# Patient Record
Sex: Female | Born: 1975 | Race: White | Hispanic: No | Marital: Single | Smoking: Never smoker
Health system: Southern US, Community
[De-identification: ages and names within clinical notes are randomized; demographics above are authoritative.]

## PROBLEM LIST (undated history)

## (undated) HISTORY — PX: MOLE REMOVAL: SHX2046

---

## 1998-06-23 ENCOUNTER — Other Ambulatory Visit: Admission: RE | Admit: 1998-06-23 | Discharge: 1998-06-23 | Payer: Self-pay | Admitting: Gynecology

## 1999-07-09 ENCOUNTER — Other Ambulatory Visit: Admission: RE | Admit: 1999-07-09 | Discharge: 1999-07-09 | Payer: Self-pay | Admitting: Gynecology

## 2003-02-11 ENCOUNTER — Other Ambulatory Visit: Admission: RE | Admit: 2003-02-11 | Discharge: 2003-02-11 | Payer: Self-pay | Admitting: Gynecology

## 2004-02-13 ENCOUNTER — Other Ambulatory Visit: Admission: RE | Admit: 2004-02-13 | Discharge: 2004-02-13 | Payer: Self-pay | Admitting: Gynecology

## 2004-08-14 ENCOUNTER — Encounter (INDEPENDENT_AMBULATORY_CARE_PROVIDER_SITE_OTHER): Payer: Self-pay | Admitting: Specialist

## 2004-08-14 ENCOUNTER — Ambulatory Visit (HOSPITAL_BASED_OUTPATIENT_CLINIC_OR_DEPARTMENT_OTHER): Admission: RE | Admit: 2004-08-14 | Discharge: 2004-08-14 | Payer: Self-pay | Admitting: Plastic Surgery

## 2004-08-14 ENCOUNTER — Ambulatory Visit (HOSPITAL_COMMUNITY): Admission: RE | Admit: 2004-08-14 | Discharge: 2004-08-14 | Payer: Self-pay | Admitting: Plastic Surgery

## 2005-02-23 ENCOUNTER — Other Ambulatory Visit: Admission: RE | Admit: 2005-02-23 | Discharge: 2005-02-23 | Payer: Self-pay | Admitting: Gynecology

## 2005-04-20 ENCOUNTER — Other Ambulatory Visit: Admission: RE | Admit: 2005-04-20 | Discharge: 2005-04-20 | Payer: Self-pay | Admitting: Gynecology

## 2008-10-01 ENCOUNTER — Ambulatory Visit: Payer: Self-pay | Admitting: Gastroenterology

## 2008-10-01 DIAGNOSIS — K625 Hemorrhage of anus and rectum: Secondary | ICD-10-CM

## 2008-10-01 LAB — CONVERTED CEMR LAB: Tissue Transglutaminase Ab, IgA: 0.3 units (ref <7)

## 2008-10-02 LAB — CONVERTED CEMR LAB
ALT: 11 units/L (ref 0–35)
AST: 25 units/L (ref 0–37)
Alkaline Phosphatase: 62 units/L (ref 39–117)
Basophils Absolute: 0 10*3/uL (ref 0.0–0.1)
Creatinine, Ser: 0.7 mg/dL (ref 0.4–1.2)
Eosinophils Absolute: 0.2 10*3/uL (ref 0.0–0.7)
GFR calc non Af Amer: 102.58 mL/min (ref >60)
HCT: 38.1 % (ref 36.0–46.0)
Hemoglobin: 13.3 g/dL (ref 12.0–15.0)
Lymphs Abs: 2.5 10*3/uL (ref 0.7–4.0)
MCHC: 35 g/dL (ref 30.0–36.0)
Monocytes Absolute: 0.5 10*3/uL (ref 0.1–1.0)
Neutro Abs: 5.4 10*3/uL (ref 1.4–7.7)
RDW: 12.3 % (ref 11.5–14.6)
Total Bilirubin: 0.8 mg/dL (ref 0.3–1.2)

## 2008-10-14 ENCOUNTER — Telehealth (INDEPENDENT_AMBULATORY_CARE_PROVIDER_SITE_OTHER): Payer: Self-pay | Admitting: *Deleted

## 2010-04-30 ENCOUNTER — Other Ambulatory Visit: Payer: Self-pay | Admitting: Gynecology

## 2010-07-24 NOTE — Op Note (Signed)
NAMEJOELEEN, WORTLEY               ACCOUNT NO.:  [id number]   MEDICAL RECORD NO.:  [id number]          PATIENT TYPE:  AMB   LOCATION:  DSC                          FACILITY:  [facility]   PHYSICIAN:  Etter Sjogren, M.D.     DATE OF BIRTH:  03/22/1975   DATE OF PROCEDURE:  08/14/2004  DATE OF DISCHARGE:                                 OPERATIVE REPORT   PREOPERATIVE DIAGNOSIS:  Melanoma in situ, left clavicle.   POSTOPERATIVE DIAGNOSES:  1.  Melanoma in situ, left clavicle.  Melanoma required an excision greater      than 1.0 cm in diameter.  2.  Complicated open wound of the left clavicle 3.5 cm.   OPERATION PERFORMED:  1.  Excision, melanoma with excision greater than 1.0 cm in diameter.  2.  Complex wound closure of left clavicle greater than 3 cm.   SURGEON:  Etter Sjogren, M.D.   ANESTHESIA:  1% Xylocaine with epinephrine plus bicarb.   INDICATIONS FOR PROCEDURE:  The patient is a 35 year old woman referred by  dermatologist, with melanoma in situ left clavicle for excision of this area  with 5 mm margins.  The nature of the procedure and the risks were discussed  with her including the distinct possibility of hypertrophic scarring,  possibility of further surgery depending upon the final pathology report.  She understood all of this and wished to proceed.   DESCRIPTION OF PROCEDURE:  The patient was taken to the operating room and  placed supine.  The area was carefully marked for the excision with margins  of approximately 5 mm around the previous shave biopsy site.  She was  prepped with Betadine and draped with sterile drapes.  Satisfactory local  anesthesia achieved.  The excision was performed as a full thickness  excision.  Irrigated thoroughly.  Good hemostasis was confirmed.  A layered  closure with 4-0 Monocryl, interrupted inverted deep sutures, 4-0 Monocryl  interrupted inverted deep dermal sutures and a running 3-0 Prolene  subcuticular suture.  Steri-Strips and dry  sterile dressing were applied.  The patient tolerated the procedure well.   DISPOSITION:  Will recheck back in 10 to 14 days.       DB/MEDQ  D:  08/14/2004  T:  08/14/2004  Job:  [id number]

## 2011-05-24 ENCOUNTER — Other Ambulatory Visit: Payer: Self-pay

## 2012-07-10 ENCOUNTER — Other Ambulatory Visit: Payer: Self-pay | Admitting: Gynecology

## 2013-07-16 ENCOUNTER — Other Ambulatory Visit: Payer: Self-pay | Admitting: Gynecology

## 2018-09-28 ENCOUNTER — Other Ambulatory Visit: Payer: Self-pay | Admitting: Obstetrics and Gynecology

## 2018-09-28 DIAGNOSIS — N644 Mastodynia: Secondary | ICD-10-CM

## 2018-10-03 ENCOUNTER — Other Ambulatory Visit: Payer: Self-pay | Admitting: Obstetrics and Gynecology

## 2018-10-03 DIAGNOSIS — N6012 Diffuse cystic mastopathy of left breast: Secondary | ICD-10-CM

## 2018-10-03 DIAGNOSIS — N644 Mastodynia: Secondary | ICD-10-CM

## 2018-10-05 ENCOUNTER — Ambulatory Visit
Admission: RE | Admit: 2018-10-05 | Discharge: 2018-10-05 | Disposition: A | Discharge status: Home / Self Care | Payer: BC Managed Care – PPO | Source: Ambulatory Visit | Attending: Obstetrics and Gynecology | Admitting: Obstetrics and Gynecology

## 2018-10-05 ENCOUNTER — Ambulatory Visit: Admission: RE | Admit: 2018-10-05 | Payer: Self-pay | Source: Ambulatory Visit

## 2018-10-05 ENCOUNTER — Other Ambulatory Visit: Payer: Self-pay

## 2018-10-05 DIAGNOSIS — N6012 Diffuse cystic mastopathy of left breast: Secondary | ICD-10-CM

## 2018-10-05 DIAGNOSIS — N644 Mastodynia: Secondary | ICD-10-CM

## 2019-09-24 ENCOUNTER — Other Ambulatory Visit: Payer: Self-pay | Admitting: Obstetrics and Gynecology

## 2019-09-24 DIAGNOSIS — R928 Other abnormal and inconclusive findings on diagnostic imaging of breast: Secondary | ICD-10-CM

## 2019-10-01 ENCOUNTER — Other Ambulatory Visit: Payer: Self-pay

## 2019-10-01 ENCOUNTER — Ambulatory Visit
Admission: RE | Admit: 2019-10-01 | Discharge: 2019-10-01 | Disposition: A | Discharge status: Home / Self Care | Payer: BC Managed Care – PPO | Source: Ambulatory Visit | Attending: Obstetrics and Gynecology | Admitting: Obstetrics and Gynecology

## 2019-10-01 DIAGNOSIS — R928 Other abnormal and inconclusive findings on diagnostic imaging of breast: Secondary | ICD-10-CM

## 2020-09-09 IMAGING — MG DIGITAL DIAGNOSTIC UNILATERAL LEFT MAMMOGRAM WITH TOMO AND CAD
4 series · 4 of 12 positions shown · non-contrast
Comparison: Previous exam(s).

CLINICAL DATA: 42-year-old patient with 1 day history of recent
stinging left breast pain that was diffuse. She is not having pain
currently. She does not palpate a lump.

EXAM:
DIGITAL DIAGNOSTIC UNILATERAL LEFT MAMMOGRAM WITH CAD AND TOMO

[L CC synth-2D]
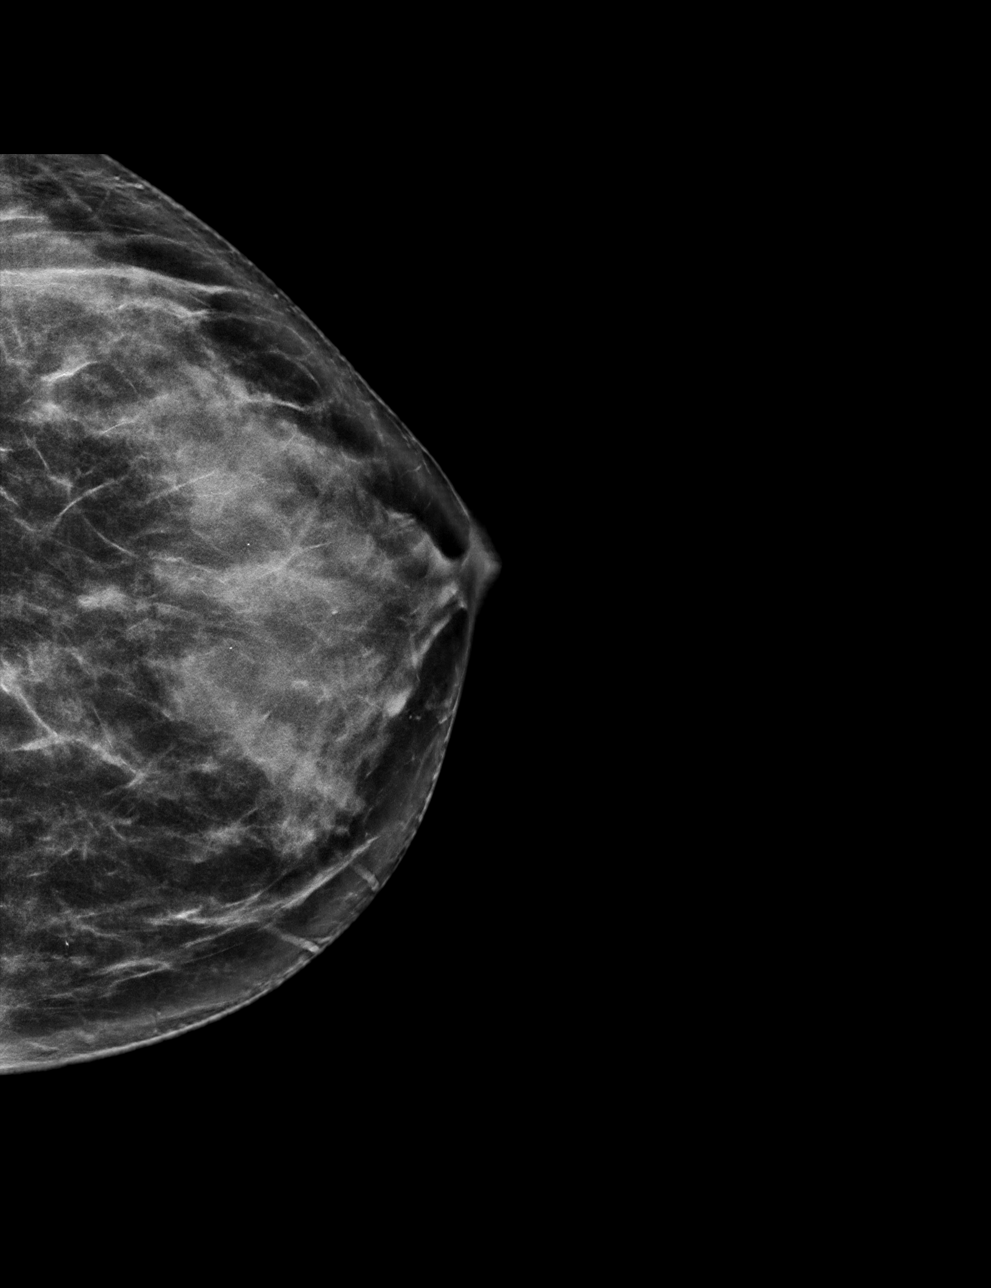

[L MLO synth-2D]
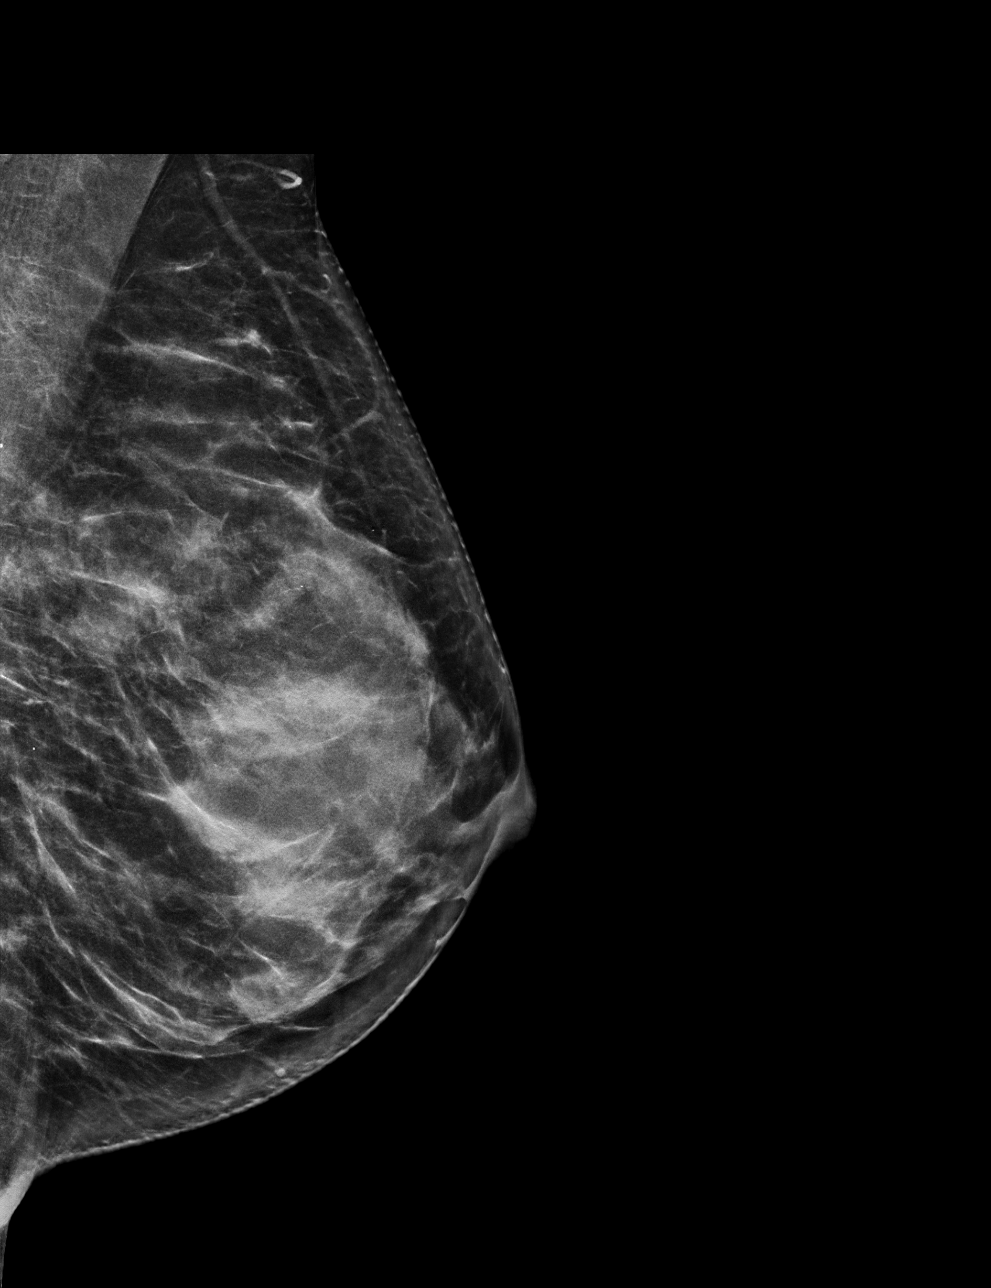

[L MLO tomo · tomo slice 34/67.0]
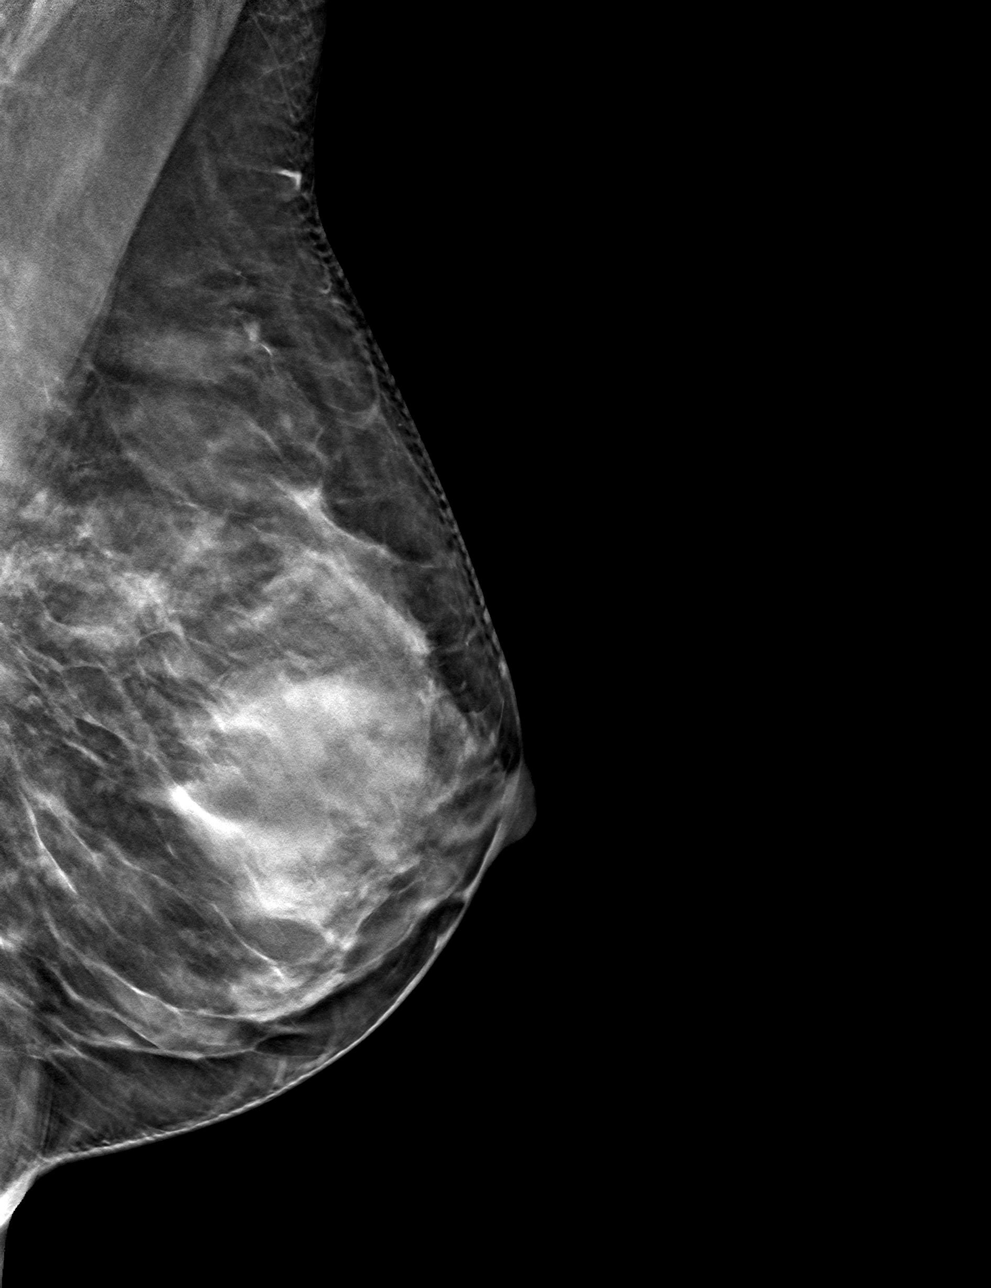

[L CC tomo · tomo slice 34/67.0]
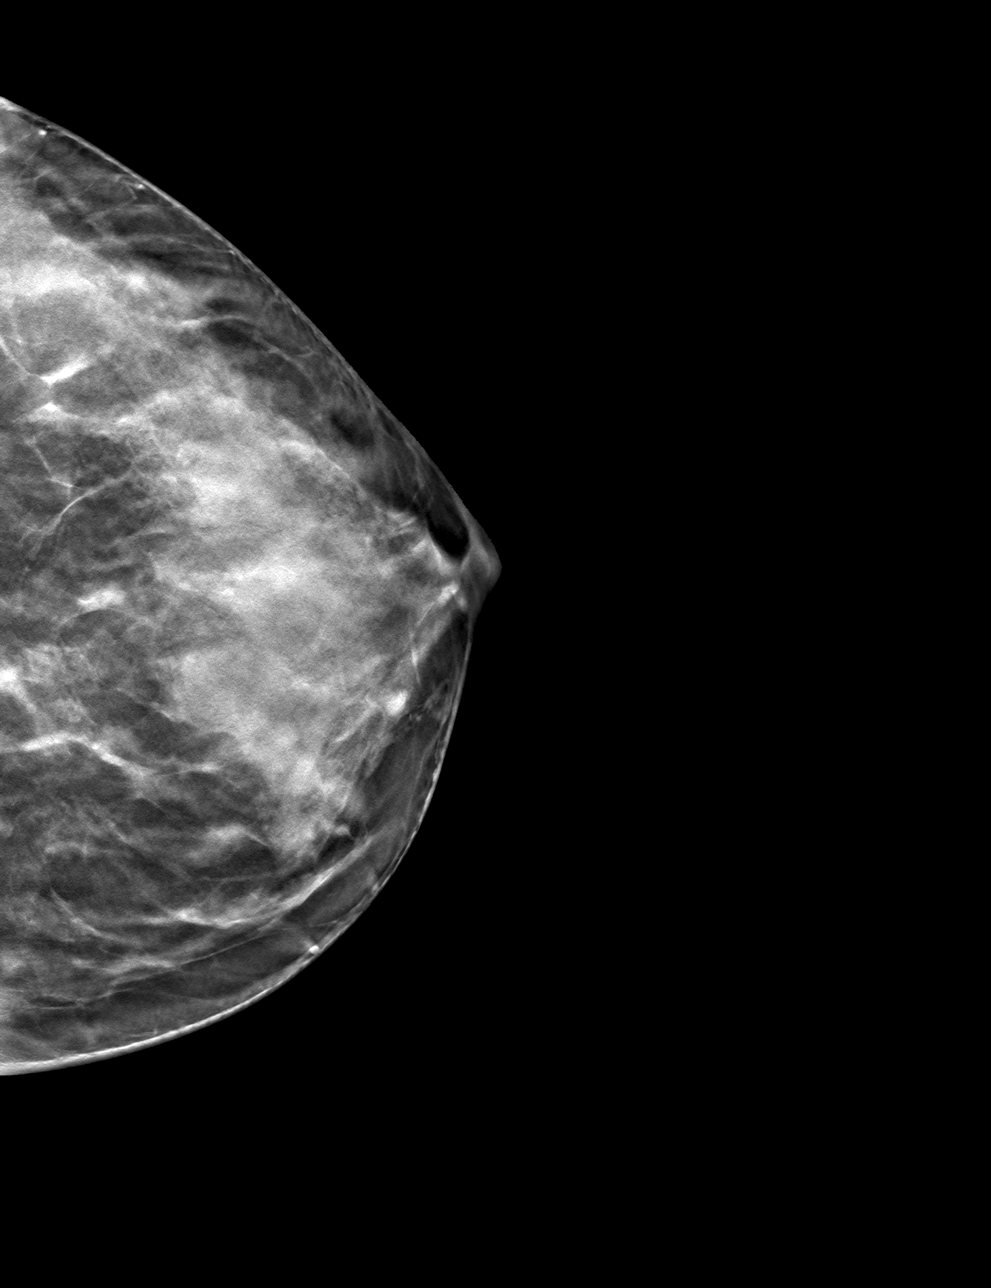

[4 of 12 positions shown; findings below may reference images not displayed]

ACR Breast Density Category c: The breast tissue is heterogeneously
dense, which may obscure small masses.
FINDINGS: No mass, architectural distortion, or suspicious microcalcification
is identified to suggest malignancy in the left breast. Normal skin
thickness.

Mammographic images were processed with CAD.
IMPRESSION: No evidence of malignancy in the left breast.

RECOMMENDATION:
Bilateral screening mammogram is recommended in July 2019.

I have discussed the findings and recommendations with the patient.
Results were also provided in writing at the conclusion of the
visit. If applicable, a reminder letter will be sent to the patient
regarding the next appointment.

BI-RADS CATEGORY  1: Negative.

## 2021-09-05 IMAGING — MG MM DIGITAL DIAGNOSTIC UNILAT*L* W/ TOMO W/ CAD
4 series · 4 of 12 positions shown · non-contrast
Comparison: Previous exam(s).

CLINICAL DATA: Screening recall for a possible left breast mass.

EXAM:
DIGITAL DIAGNOSTIC LEFT MAMMOGRAM WITH CAD AND TOMO
ULTRASOUND LEFT BREAST

[L CC synth-2D]
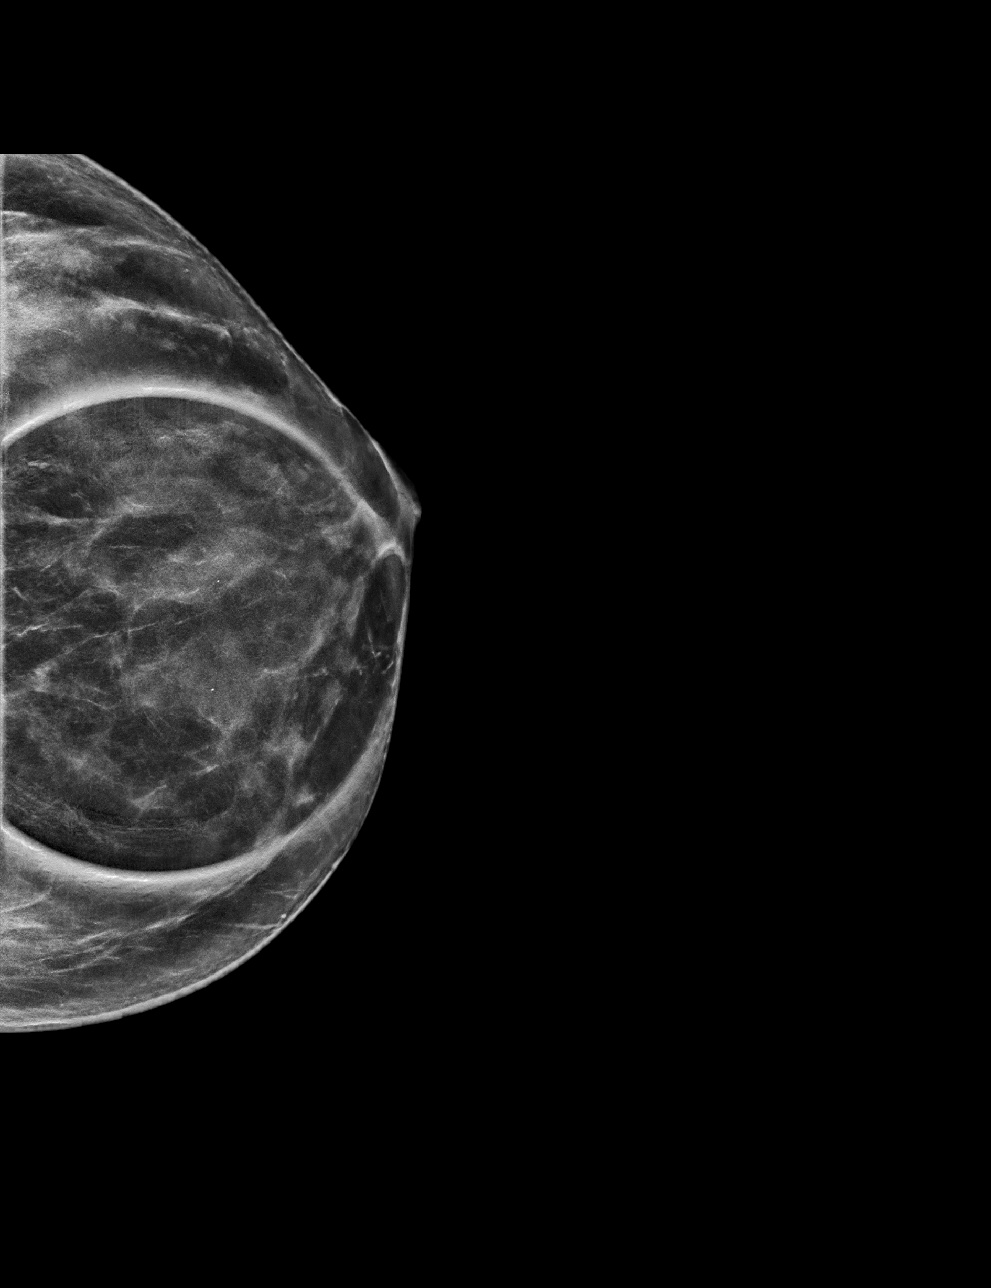

[L MLO synth-2D]
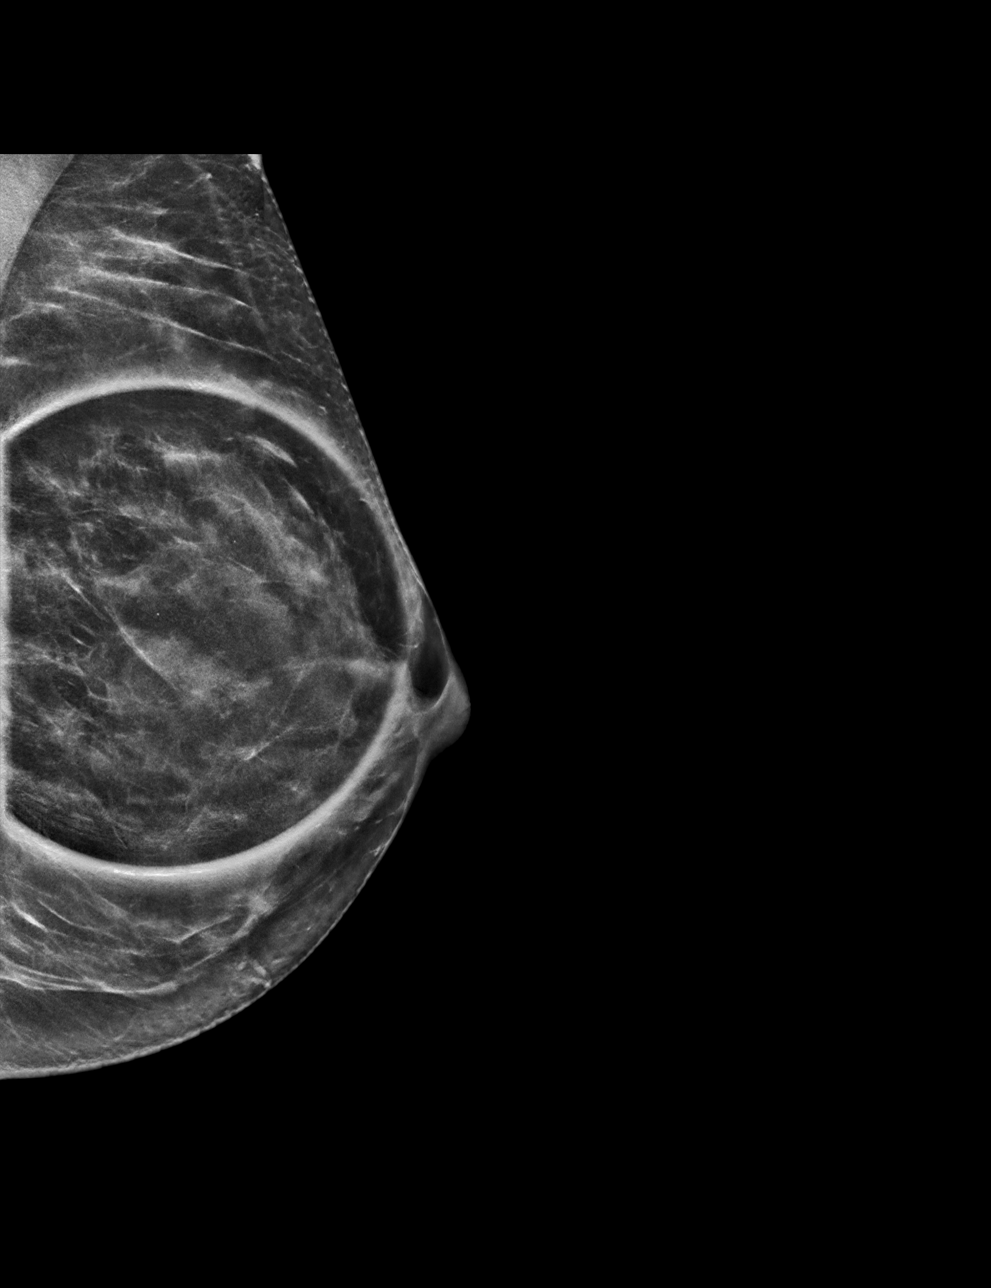

[L CC tomo · tomo slice 31/61.0]
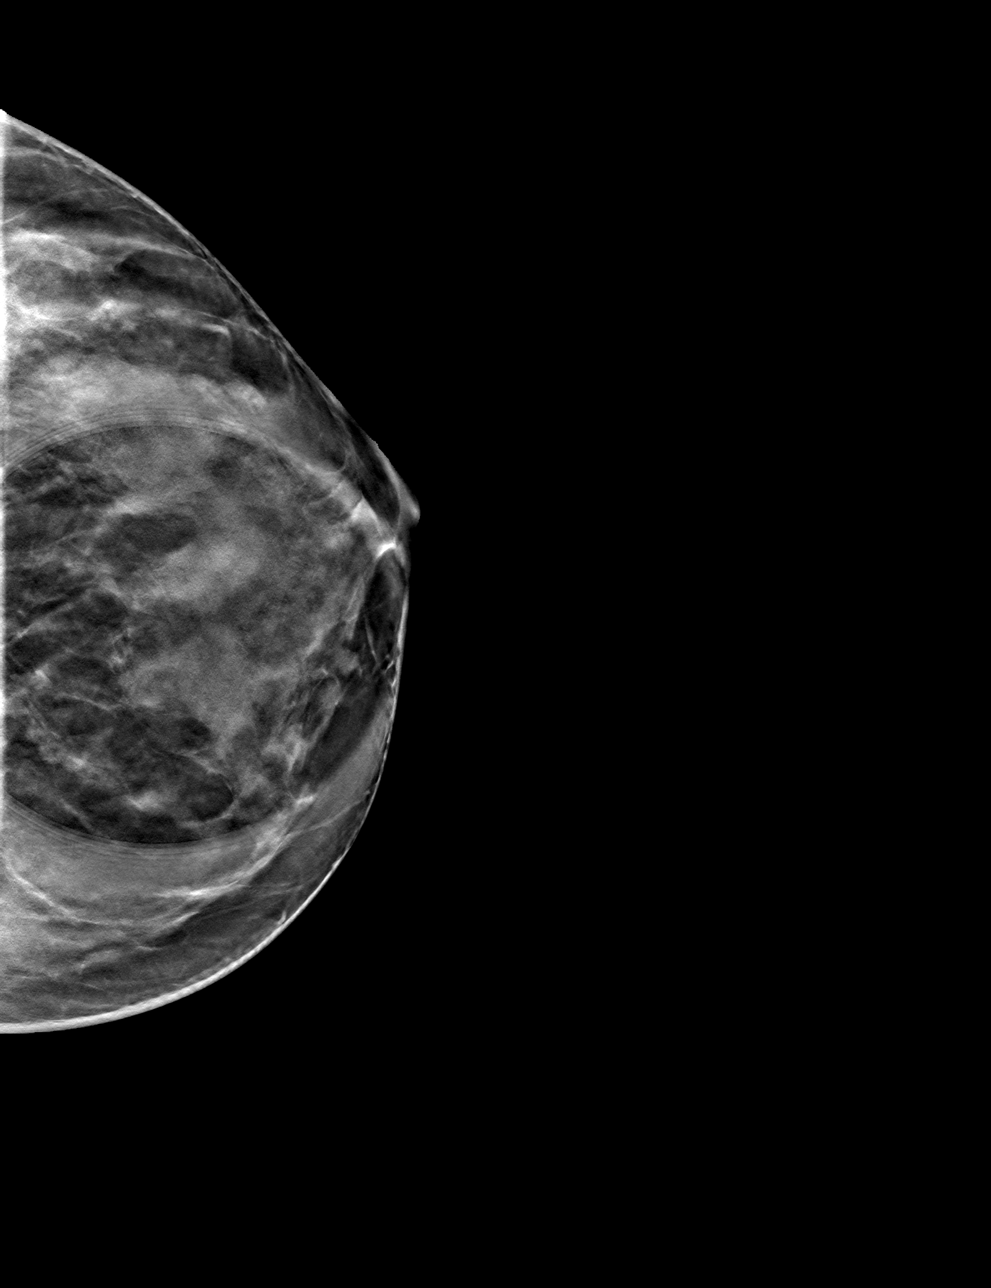

[L MLO tomo · tomo slice 32/63.0]
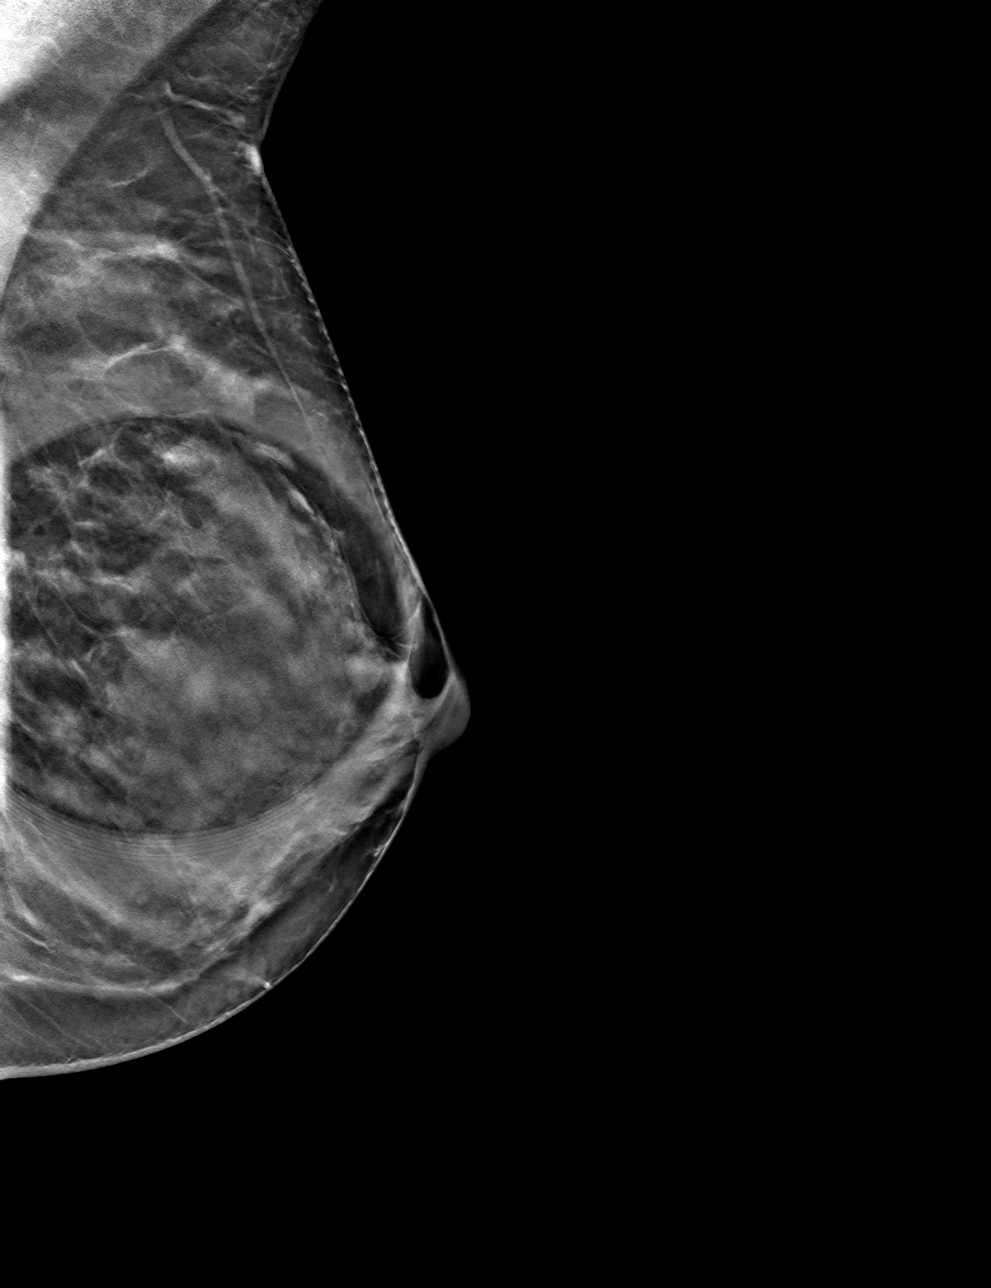

[4 of 12 positions shown; findings below may reference images not displayed]

ACR Breast Density Category c: The breast tissue is heterogeneously
dense, which may obscure small masses.
FINDINGS: The possible mass noted in the central, slightly medial aspect of
the left breast, disperses on spot compression imaging. There is no
underlying mass, no architectural distortion and there are no
suspicious calcifications.

Mammographic images were processed with CAD

Targeted ultrasound is performed, showing heterogeneous
fibroglandular tissue in the retroareolar and medial periareolar
left breast. There is a small cyst the 12 o'clock retroareolar left
breast measuring 6 x 4 x 5 mm. There are no solid masses or
suspicious lesions.
IMPRESSION: 1. No evidence of breast malignancy.
2. Small benign left breast cyst.

RECOMMENDATION:
Screening mammogram in one year.(Code:[id number])

I have discussed the findings and recommendations with the patient.
If applicable, a reminder letter will be sent to the patient
regarding the next appointment.

BI-RADS CATEGORY  2: Benign.

## 2021-12-08 ENCOUNTER — Encounter: Payer: Self-pay | Admitting: Pulmonary Disease

## 2021-12-08 ENCOUNTER — Other Ambulatory Visit: Payer: Self-pay

## 2021-12-08 ENCOUNTER — Other Ambulatory Visit: Payer: Self-pay | Admitting: Obstetrics and Gynecology

## 2021-12-08 DIAGNOSIS — D509 Iron deficiency anemia, unspecified: Secondary | ICD-10-CM | POA: Insufficient documentation

## 2021-12-08 DIAGNOSIS — R945 Abnormal results of liver function studies: Secondary | ICD-10-CM

## 2021-12-08 DIAGNOSIS — D508 Other iron deficiency anemias: Secondary | ICD-10-CM

## 2021-12-09 ENCOUNTER — Telehealth: Payer: Self-pay | Admitting: Pharmacy Technician

## 2021-12-09 NOTE — Telephone Encounter (Addendum)
Auth Submission: NO AUTH NEEDED Payer: [employer] Medication & CPT/J Code(s) submitted: Venofer (Iron Sucrose) J1756 Route of submission (phone, fax, portal):  Phone # Fax # Auth type: Buy/Bill Units/visits requested: 300MG  X 3 DOSES Reference number:  Approval from: 12/09/21 to 03/07/22 at [place] as Buy/Bill   [employer]: [phone] ID: [id number] GR: [id number]

## 2021-12-11 ENCOUNTER — Encounter: Payer: Self-pay | Admitting: Pulmonary Disease

## 2021-12-11 ENCOUNTER — Ambulatory Visit
Admission: RE | Admit: 2021-12-11 | Discharge: 2021-12-11 | Disposition: A | Discharge status: Home / Self Care | Payer: BC Managed Care – PPO | Source: Ambulatory Visit | Attending: Obstetrics and Gynecology | Admitting: Obstetrics and Gynecology

## 2021-12-11 DIAGNOSIS — R945 Abnormal results of liver function studies: Secondary | ICD-10-CM

## 2021-12-17 ENCOUNTER — Ambulatory Visit (INDEPENDENT_AMBULATORY_CARE_PROVIDER_SITE_OTHER): Payer: BC Managed Care – PPO

## 2021-12-17 VITALS — BP 100/64 | HR 57 | Temp 97.8°F | Resp 18 | Ht 69.0 in | Wt 131.0 lb

## 2021-12-17 DIAGNOSIS — D508 Other iron deficiency anemias: Secondary | ICD-10-CM

## 2021-12-17 MED ORDER — SODIUM CHLORIDE 0.9 % IV SOLN
300.0000 mg | Freq: Once | INTRAVENOUS | Status: AC
  Administered 2021-12-17: 300 mg via INTRAVENOUS
  Filled 2021-12-17: qty 15

## 2021-12-17 NOTE — Patient Instructions (Signed)

## 2021-12-17 NOTE — Progress Notes (Signed)
Diagnosis: Iron Deficiency Anemia  Provider:  Marshell Garfinkel MD  Procedure: Infusion  IV Type: Peripheral, IV Location: L Antecubital  Venofer (Iron Sucrose), Dose: 300 mg  Infusion Start Time: 2820  Infusion Stop Time: 8138  Post Infusion IV Care: Observation period completed and Peripheral IV Discontinued  Discharge: Condition: Good, Destination: Home . AVS provided to patient.   Performed by:  Cleophus Molt, RN

## 2021-12-23 ENCOUNTER — Ambulatory Visit (INDEPENDENT_AMBULATORY_CARE_PROVIDER_SITE_OTHER): Payer: BC Managed Care – PPO

## 2021-12-23 VITALS — BP 95/59 | HR 65 | Temp 98.7°F | Resp 16 | Ht 69.0 in | Wt 128.4 lb

## 2021-12-23 DIAGNOSIS — D508 Other iron deficiency anemias: Secondary | ICD-10-CM | POA: Diagnosis not present

## 2021-12-23 MED ORDER — SODIUM CHLORIDE 0.9 % IV SOLN
300.0000 mg | Freq: Once | INTRAVENOUS | Status: AC
  Administered 2021-12-23: 300 mg via INTRAVENOUS
  Filled 2021-12-23: qty 15

## 2021-12-23 NOTE — Progress Notes (Signed)
Diagnosis: Iron Deficiency Anemia  Provider:  Marshell Garfinkel MD  Procedure: Infusion  IV Type: Peripheral, IV Location: R Antecubital  Venofer (Iron Sucrose), Dose: 300 mg  Infusion Start Time: 6237  Infusion Stop Time: 1625  Post Infusion IV Care: Peripheral IV Discontinued  Discharge: Condition: Good, Destination: Home . AVS provided to patient.   Performed by:  Adelina Mings, LPN

## 2021-12-31 ENCOUNTER — Ambulatory Visit (INDEPENDENT_AMBULATORY_CARE_PROVIDER_SITE_OTHER): Payer: BC Managed Care – PPO

## 2021-12-31 VITALS — BP 93/55 | HR 53 | Temp 97.9°F | Resp 18 | Ht 69.0 in | Wt 127.2 lb

## 2021-12-31 DIAGNOSIS — D508 Other iron deficiency anemias: Secondary | ICD-10-CM

## 2021-12-31 MED ORDER — ACETAMINOPHEN 325 MG PO TABS: 650.0000 mg | ORAL_TABLET | Freq: Once | ORAL | Status: DC

## 2021-12-31 MED ORDER — DIPHENHYDRAMINE HCL 25 MG PO CAPS: 25.0000 mg | ORAL_CAPSULE | Freq: Once | ORAL | Status: DC

## 2021-12-31 MED ORDER — SODIUM CHLORIDE 0.9 % IV SOLN
300.0000 mg | Freq: Once | INTRAVENOUS | Status: AC
  Administered 2021-12-31: 300 mg via INTRAVENOUS
  Filled 2021-12-31: qty 15

## 2021-12-31 NOTE — Progress Notes (Signed)
Diagnosis: Iron Deficiency Anemia  Provider:  Marshell Garfinkel MD  Procedure: Infusion  IV Type: Peripheral, IV Location: R Antecubital  Venofer (Iron Sucrose), Dose: 300 mg  Infusion Start Time: 3557  Infusion Stop Time: 3220  Post Infusion IV Care: Peripheral IV Discontinued  Discharge: Condition: Good, Destination: Home . AVS provided to patient.   Performed by:  Cleophus Molt, RN

## 2022-02-08 ENCOUNTER — Emergency Department (HOSPITAL_COMMUNITY): Payer: BC Managed Care – PPO

## 2022-02-08 ENCOUNTER — Other Ambulatory Visit: Payer: Self-pay

## 2022-02-08 ENCOUNTER — Emergency Department (HOSPITAL_COMMUNITY)
Admission: EM | Admit: 2022-02-08 | Discharge: 2022-02-08 | Disposition: A | Discharge status: Home / Self Care | Payer: BC Managed Care – PPO | Attending: Emergency Medicine | Admitting: Emergency Medicine

## 2022-02-08 ENCOUNTER — Encounter (HOSPITAL_COMMUNITY): Payer: Self-pay

## 2022-02-08 DIAGNOSIS — F419 Anxiety disorder, unspecified: Secondary | ICD-10-CM | POA: Diagnosis not present

## 2022-02-08 DIAGNOSIS — R0789 Other chest pain: Secondary | ICD-10-CM | POA: Diagnosis present

## 2022-02-08 LAB — BASIC METABOLIC PANEL
Anion gap: 8 (ref 5–15)
BUN: 8 mg/dL (ref 6–20)
CO2: 27 mmol/L (ref 22–32)
Calcium: 9.5 mg/dL (ref 8.9–10.3)
Chloride: 103 mmol/L (ref 98–111)
Creatinine, Ser: 0.74 mg/dL (ref 0.44–1.00)
GFR, Estimated: >60 mL/min (ref >60)
Glucose, Bld: 118 mg/dL — ABNORMAL HIGH (ref 70–99)
Potassium: 3.8 mmol/L (ref 3.5–5.1)
Sodium: 138 mmol/L (ref 135–145)

## 2022-02-08 LAB — CBC
HCT: 39.8 % (ref 36.0–46.0)
Hemoglobin: 12.6 g/dL (ref 12.0–15.0)
MCH: 28.4 pg (ref 26.0–34.0)
MCHC: 31.7 g/dL (ref 30.0–36.0)
MCV: 89.8 fL (ref 80.0–100.0)
Platelets: 302 10*3/uL (ref 150–400)
RBC: 4.43 MIL/uL (ref 3.87–5.11)
RDW: 17.1 % — ABNORMAL HIGH (ref 11.5–15.5)
WBC: 9 10*3/uL (ref 4.0–10.5)
nRBC: 0 % (ref 0.0–0.2)

## 2022-02-08 LAB — TROPONIN I (HIGH SENSITIVITY)
Troponin I (High Sensitivity): <2 ng/L (ref <18)
Troponin I (High Sensitivity): 3 ng/L (ref <18)

## 2022-02-08 LAB — I-STAT BETA HCG BLOOD, ED (MC, WL, AP ONLY): I-stat hCG, quantitative: <5 m[IU]/mL (ref <5)

## 2022-02-08 MED ORDER — LORAZEPAM 1 MG PO TABS
0.5000 mg | ORAL_TABLET | Freq: Once | ORAL | Status: AC
  Administered 2022-02-08: 0.5 mg via ORAL
  Filled 2022-02-08: qty 1

## 2022-02-08 NOTE — ED Triage Notes (Signed)
Pt states that she has been having numbness and tingling in bilateral feet and hands, tightness in her jaw and heaviness in her chest. Pt felt like her heart was racing, reports very stressed, anxious and tearful in triage.

## 2022-02-08 NOTE — ED Provider Notes (Signed)
[facility] EMERGENCY DEPARTMENT Provider Note  CSN: [id number] Arrival date & time: 02/08/22 0209  Chief Complaint(s) Chest Pain  HPI Tina Cline is a 46 y.o. female without significant past medical history presenting to the emergency department with chest pain.  Patient reports that she developed chest pressure this morning, reports that it was associated with tingling in the hands and feet, jaw pressure.  She also reports significant anxiety, has had some liver abnormalities which she is seeing GI for which is causing her stress.  She also reports palpitations.  No nausea, vomiting, diaphoresis, syncope.  Pain is not exertional or pleuritic.  No recent travel or surgeries.  Was previously on OCPs but not taking since August.  Non-smoker.  No leg pain.  Pain does not radiate.   Past Medical History History reviewed. No pertinent past medical history. Patient Active Problem List   Diagnosis Date Noted   Iron deficiency anemia, unspecified 12/08/2021   HEMORRHAGE OF RECTUM AND ANUS 10/01/2008   Home Medication(s) Prior to Admission medications   Not on File                                                                                                                                    Past Surgical History History reviewed. No pertinent surgical history. Family History History reviewed. No pertinent family history.  Social History   Allergies Penicillins  Review of Systems Review of Systems  All other systems reviewed and are negative.   Physical Exam Vital Signs  I have reviewed the triage vital signs BP 117/74   Pulse 63   Temp 98.7 F (37.1 C) (Oral)   Resp 17   SpO2 100%  Physical Exam Vitals and nursing note reviewed.  Constitutional:      General: She is not in acute distress.    Appearance: She is well-developed.  HENT:     Head: Normocephalic and atraumatic.     Mouth/Throat:     Mouth: Mucous membranes are moist.  Eyes:      Pupils: Pupils are equal, round, and reactive to light.  Cardiovascular:     Rate and Rhythm: Normal rate and regular rhythm.     Heart sounds: No murmur heard. Pulmonary:     Effort: Pulmonary effort is normal. No respiratory distress.     Breath sounds: Normal breath sounds.  Abdominal:     General: Abdomen is flat.     Palpations: Abdomen is soft.     Tenderness: There is no abdominal tenderness.  Musculoskeletal:        General: No tenderness.     Right lower leg: No edema.     Left lower leg: No edema.  Skin:    General: Skin is warm and dry.  Neurological:     General: No focal deficit present.     Mental Status: She is alert. Mental status is at baseline.  Psychiatric:  Mood and Affect: Mood normal.        Behavior: Behavior normal.     ED Results and Treatments Labs (all labs ordered are listed, but only abnormal results are displayed) Labs Reviewed  BASIC METABOLIC PANEL - Abnormal; Notable for the following components:      Result Value   Glucose, Bld 118 (*)    All other components within normal limits  CBC - Abnormal; Notable for the following components:   RDW 17.1 (*)    All other components within normal limits  I-STAT BETA HCG BLOOD, ED ([facility], [facility], [facility] ONLY)  TROPONIN I (HIGH SENSITIVITY)  TROPONIN I (HIGH SENSITIVITY)                                                                                                                          Radiology DG Chest 2 View  Result Date: 02/08/2022 CLINICAL DATA:  Chest pain EXAM: CHEST - 2 VIEW COMPARISON:  None Available. FINDINGS: The heart size and mediastinal contours are within normal limits. Both lungs are clear. The visualized skeletal structures are unremarkable. IMPRESSION: No active cardiopulmonary disease. Electronically Signed   By: Charlett Nose M.D.   On: 02/08/2022 02:46    Pertinent labs & imaging results that were available during my care of the patient were reviewed by me and considered in my  medical decision making (see MDM for details).  Medications Ordered in ED Medications  LORazepam (ATIVAN) tablet 0.5 mg (has no administration in time range)                                                                                                                                     Procedures Procedures  (including critical care time)  Medical Decision Making / ED Course   MDM:  46 year old female presenting to the emergency department with chest pressure.  Patient well-appearing.  Vital signs reassuring.  Exam unremarkable.  EKG notable for T wave inversions in anterior and inferior leads.  Suspect most likely cause of chest pressure is anxiety given patient history.  Low concern for ACS, however EKG does have T wave inversions.  History is atypical for ACS and troponin negative x 2.  Patient PERC negative.  Doubt dissection given age, reassuring chest x-ray, lack of severe pain, reassuring vital signs.  Chest x-ray without evidence of pneumonia, pneumothorax.  No nausea or vomiting to suggest  esophageal pathology.  Given abnormal EKG, will discuss with cardiology, but likely discharge unless cardiology would prefer patient be admitted for further inpatient evaluation.  Heart score is only 2.  Clinical Course as of 02/08/22 0905  Mon Feb 08, 2022  0973 Discussed with Dr. Wyline Mood of cardiology who reviewed ECG.  Delta troponin negative.  Cardiology agrees that patient should be stable for outpatient follow-up given atypical symptoms and negative troponin. Will discharge patient to home. All questions answered. Patient comfortable with plan of discharge. Return precautions discussed with patient and specified on the after visit summary.  [WS]    Clinical Course User Index [WS] Lonell Grandchild, MD     Additional history obtained: -Additional history obtained from family -External records from outside source obtained and reviewed including: Chart review including previous  notes, labs, imaging, consultation notes including GI notes from october   Lab Tests: -I ordered, reviewed, and interpreted labs.   The pertinent results include:   Labs Reviewed  BASIC METABOLIC PANEL - Abnormal; Notable for the following components:      Result Value   Glucose, Bld 118 (*)    All other components within normal limits  CBC - Abnormal; Notable for the following components:   RDW 17.1 (*)    All other components within normal limits  I-STAT BETA HCG BLOOD, ED ([facility], [facility], [facility] ONLY)  TROPONIN I (HIGH SENSITIVITY)  TROPONIN I (HIGH SENSITIVITY)    Notable for negative troponin x2  EKG   EKG Interpretation  Date/Time:  Monday February 08 2022 02:04:35 EST Ventricular Rate:  85 PR Interval:  146 QRS Duration: 80 QT Interval:  362 QTC Calculation: 430 R Axis:   78 Text Interpretation: Sinus rhythm with sinus arrhythmia with occasional Premature ventricular complexes Nonspecific T wave abnormality Abnormal ECG No previous ECGs available Confirmed by Alvino Blood ([id number]) on 02/08/2022 8:20:00 AM       Imaging Studies ordered: I ordered imaging studies including CXR On my interpretation imaging demonstrates no acute process I independently visualized and interpreted imaging. I agree with the radiologist interpretation   Medicines ordered and prescription drug management: Meds ordered this encounter  Medications   LORazepam (ATIVAN) tablet 0.5 mg    -I have reviewed the patients home medicines and have made adjustments as needed   Consultations Obtained: I requested consultation with the cardiology,  and discussed lab and imaging findings as well as pertinent plan - they recommend: outpatient follow up    Reevaluation: After the interventions noted above, I reevaluated the patient and found that they have improved  Co morbidities that complicate the patient evaluation History reviewed. No pertinent past medical history.    Dispostion: Disposition  decision including need for hospitalization was considered, and patient discharged from emergency department.    Final Clinical Impression(s) / ED Diagnoses Final diagnoses:  Atypical chest pain  Anxiety     This chart was dictated using voice recognition software.  Despite best efforts to proofread,  errors can occur which can change the documentation meaning.    Lonell Grandchild, MD 02/08/22 [phone]

## 2022-02-08 NOTE — Discharge Instructions (Addendum)
We evaluated you in the emergency department for your chest pain.  Your symptoms were not typical for a heart attack and your cardiac enzymes were negative.  Your EKG had some abnormalities, but given that the rest of your workup was reassuring, the cardiology team feels that it is safe to follow-up with them as an outpatient.  I placed a referral to cardiology.  They should call you in the next few days for a follow-up appointment. If you have not heard from the Cardiology office within the next 72 hours please call [phone].  Your symptoms are probably more related to anxiety.  Please take the medication you are prescribed as needed.  If you develop any new or worsening symptoms such as difficulty breathing, nausea or vomiting, severe pain, fainting, or any other concerning symptoms, please return to the emergency department.

## 2022-02-09 NOTE — Progress Notes (Unsigned)
Cardiology Office Note:    Date:  02/11/2022   ID:  Tina Cline, DOB Aug 13, 1975, MRN [id number]  PCP:  Leonides Sake, MD   [place] Providers Cardiologist:  None     Referring MD: Leonides Sake, MD   Chief Complaint  Patient presents with   Palpitations    History of Present Illness:    Tina Cline is a 46 y.o. female who is seen at the request of Dr Lisbeth Ply for evaluation of palpitations. She is generally in good health. This past Monday morning she awoke with her heart racing and numbness in her hands and feet. She felt flushed. Didn't feel herself. She denies any chest pain or dyspnea. Thinks it lasted about 10-25 minutes and states it seemed to get better once she was able to talk to someone. She has been very anxious about some abnormality of her liver that is being evaluated by GI. She has no history of HTN, HLD, tobacco use or DM  History reviewed. No pertinent past medical history.  Past Surgical History:  Procedure Laterality Date   MOLE REMOVAL Left     Current Medications: Current Meds  Medication Sig   KLONOPIN 0.5 MG tablet Take 1 tablet twice a day by oral route as needed.     Allergies:   Penicillins   Social History   Socioeconomic History   Marital status: Single    Spouse name: Not on file   Number of children: 0   Years of education: Not on file   Highest education level: Not on file  Occupational History   Not on file  Tobacco Use   Smoking status: Never   Smokeless tobacco: Never  Substance and Sexual Activity   Alcohol use: Not on file   Drug use: Not on file   Sexual activity: Not on file  Other Topics Concern   Not on file  Social History Narrative   Accounts payable at [employer] Strain: Not on file  Food Insecurity: Not on file  Transportation Needs: Not on file  Physical Activity: Not on file  Stress: Not on file  Social Connections: Not on file      Family History: The patient's family history includes Diabetes in her mother; Heart attack in her father; Stroke in her mother.  ROS:   Please see the history of present illness.     All other systems reviewed and are negative.  EKGs/Labs/Other Studies Reviewed:    The following studies were reviewed today: none  EKG:  EKG is not  ordered today.  The ekg ordered 02/08/22 demonstrates NSR with occ PVC. ST T wave abnormality c/w inferolateral ischemia. I have personally reviewed and interpreted this study.   Recent Labs: 02/08/2022: BUN 8; Creatinine, Ser 0.74; Hemoglobin 12.6; Platelets 302; Potassium 3.8; Sodium 138  Recent Lipid Panel No results found for: "CHOL", "TRIG", "HDL", "CHOLHDL", "VLDL", "LDLCALC", "LDLDIRECT" Dated 12/02/21: cholesterol 177, triglycerides 67, HDL 65, LDL 99. A2c 5.3%. Hgb 10.2, AST and TSH normal.   Risk Assessment/Calculations:                Physical Exam:    VS:  BP 106/78   Pulse (!) 59   Ht 5\' 9"  (1.753 m)   Wt 126 lb 12.8 oz (57.5 kg)   SpO2 100%   BMI 18.73 kg/m     Wt Readings from Last 3 Encounters:  02/11/22 126 lb 12.8  oz (57.5 kg)  12/31/21 127 lb 3.2 oz (57.7 kg)  12/23/21 128 lb 6.4 oz (58.2 kg)     GEN:  Well nourished, well developed in no acute distress HEENT: Normal NECK: No JVD; No carotid bruits LYMPHATICS: No lymphadenopathy CARDIAC: RRR, no murmurs, rubs, gallops RESPIRATORY:  Clear to auscultation without rales, wheezing or rhonchi  ABDOMEN: Soft, non-tender, non-distended MUSCULOSKELETAL:  No edema; No deformity  SKIN: Warm and dry NEUROLOGIC:  Alert and oriented x 3 PSYCHIATRIC:  Normal affect   ASSESSMENT:    1. Palpitations   2. Nonspecific abnormal electrocardiogram (ECG) (EKG)    PLAN:    In order of problems listed above:  She may well have a primary arrhythmia. She did have some PVCs in ED. She is quite anxious and this may all be a panic attack. I have recommended a 2 week Zio patch monitor.  Given her abnormal Ecg will also check an Echocardiogram. Risk for CAD is low and troponin levels were normal. If above studies are normal will reassure.            Medication Adjustments/Labs and Tests Ordered: Current medicines are reviewed at length with the patient today.  Concerns regarding medicines are outlined above.  No orders of the defined types were placed in this encounter.  No orders of the defined types were placed in this encounter.   There are no Patient Instructions on file for this visit.   Signed, Joeanne Robicheaux [place], MD  02/11/2022 4:40 PM    [facility]

## 2022-02-11 ENCOUNTER — Ambulatory Visit: Payer: BC Managed Care – PPO | Attending: Cardiology | Admitting: Cardiology

## 2022-02-11 ENCOUNTER — Ambulatory Visit: Payer: BC Managed Care – PPO | Attending: Cardiology

## 2022-02-11 ENCOUNTER — Encounter: Payer: Self-pay | Admitting: Cardiology

## 2022-02-11 VITALS — BP 106/78 | HR 59 | Ht 69.0 in | Wt 126.8 lb

## 2022-02-11 DIAGNOSIS — R002 Palpitations: Secondary | ICD-10-CM

## 2022-02-11 DIAGNOSIS — R9431 Abnormal electrocardiogram [ECG] [EKG]: Secondary | ICD-10-CM | POA: Diagnosis not present

## 2022-02-11 NOTE — Patient Instructions (Signed)
Medication Instructions:  Continue same medications   Lab Work: None ordered   Testing/Procedures: Echo  14 day heart monitor ( ZIO ) will be mailed to your home   Follow-Up: At [facility], you and your health needs are our priority.  As part of our continuing mission to provide you with exceptional heart care, we have created designated Provider Care Teams.  These Care Teams include your primary Cardiologist (physician) and Advanced Practice Providers (APPs -  Physician Assistants and Nurse Practitioners) who all work together to provide you with the care you need, when you need it.  We recommend signing up for the patient portal called "MyChart".  Sign up information is provided on this After Visit Summary.  MyChart is used to connect with patients for Virtual Visits (Telemedicine).  Patients are able to view lab/test results, encounter notes, upcoming appointments, etc.  Non-urgent messages can be sent to your provider as well.   To learn more about what you can do with MyChart, go to [email or web address].    Your next appointment:  To Be Determined After Test    The format for your next appointment: Office    Provider: Dr.Jordan    Important Information About Sugar

## 2022-02-11 NOTE — Progress Notes (Unsigned)
Enrolled for [employer] to mail a ZIO XT long term holter monitor to the patients address on file.  

## 2022-02-17 DIAGNOSIS — R9431 Abnormal electrocardiogram [ECG] [EKG]: Secondary | ICD-10-CM | POA: Diagnosis not present

## 2022-02-17 DIAGNOSIS — R002 Palpitations: Secondary | ICD-10-CM | POA: Diagnosis not present

## 2022-03-11 ENCOUNTER — Ambulatory Visit (HOSPITAL_COMMUNITY): Payer: BC Managed Care – PPO | Attending: Cardiology

## 2022-03-11 DIAGNOSIS — R9431 Abnormal electrocardiogram [ECG] [EKG]: Secondary | ICD-10-CM

## 2022-03-11 DIAGNOSIS — R002 Palpitations: Secondary | ICD-10-CM | POA: Diagnosis not present

## 2022-03-11 LAB — ECHOCARDIOGRAM COMPLETE
Area-P 1/2: 2.46 cm2
S' Lateral: 3.4 cm

## 2022-04-12 ENCOUNTER — Other Ambulatory Visit: Payer: Self-pay | Admitting: Obstetrics and Gynecology

## 2023-03-07 ENCOUNTER — Encounter: Payer: Self-pay | Admitting: Obstetrics and Gynecology

## 2023-10-14 ENCOUNTER — Ambulatory Visit (HOSPITAL_BASED_OUTPATIENT_CLINIC_OR_DEPARTMENT_OTHER): Admission: EM | Admit: 2023-10-14 | Discharge: 2023-10-14 | Disposition: A | Discharge status: Home / Self Care

## 2023-10-14 ENCOUNTER — Ambulatory Visit (HOSPITAL_BASED_OUTPATIENT_CLINIC_OR_DEPARTMENT_OTHER): Admit: 2023-10-14 | Discharge: 2023-10-14 | Disposition: A | Discharge status: Home / Self Care | Admitting: Radiology

## 2023-10-14 ENCOUNTER — Encounter (HOSPITAL_BASED_OUTPATIENT_CLINIC_OR_DEPARTMENT_OTHER): Payer: Self-pay | Admitting: Emergency Medicine

## 2023-10-14 DIAGNOSIS — R051 Acute cough: Secondary | ICD-10-CM

## 2023-10-14 DIAGNOSIS — M940 Chondrocostal junction syndrome [Tietze]: Secondary | ICD-10-CM

## 2023-10-14 MED ORDER — DICLOFENAC SODIUM 75 MG PO TBEC: 75.0000 mg | DELAYED_RELEASE_TABLET | Freq: Two times a day (BID) | ORAL | 0 refills | Status: AC | PRN

## 2023-10-14 MED ORDER — PROMETHAZINE-DM 6.25-15 MG/5ML PO SYRP: 5.0000 mL | ORAL_SOLUTION | Freq: Four times a day (QID) | ORAL | 0 refills | Status: AC | PRN

## 2023-10-14 NOTE — ED Triage Notes (Signed)
 Pt c/o coughing for several weeks, she states ears having been hurting and chest discomfort from coughing went to another urgent care on Monday was prescribed benzonate, z pack, and prednisone she has completed the medication but has not improve any.

## 2023-10-14 NOTE — Discharge Instructions (Addendum)
 Persistent cough and some rib discomfort with cough: Chest x-ray is completely normal.  Vital signs are excellent with great oxygen saturation and exam findings are normal.  Patient has reactive airway style cough that just needs more time to resolve.  Promethazine  DM, 5 mL, every 6 hours if needed for cough.  Diclofenac  75 mg, 1 pill twice daily with food if needed for rib pain from coughing.  Get plenty of fluids and rest.  Follow-up if symptoms do not improve, worsen

## 2023-10-14 NOTE — ED Provider Notes (Signed)
 [facility]    CSN: [id number] Arrival date & time: 10/14/23  1835      History   Chief Complaint No chief complaint on file.   HPI Tina Cline is a 48 y.o. female.   48 year old female who has had a cough and chest congestion since approximately 09/20/2023 or earlier.  She has had some chest discomfort and pain when coughing and just a chronic spasmodic cough.  She was seen at urgent care at another location on 10/10/2023.  She was provided azithromycin Z-Pak, prednisone and benzonatate or Tessalon Perles.  She has completed all the medication with last doses taken today.  She feels like she still has a persistent cough and she is not sure that she got much improvement from the medications that she had.  She worries that she might have something going on in her chest.     History reviewed. No pertinent past medical history.  Patient Active Problem List   Diagnosis Date Noted   Iron  deficiency anemia, unspecified 12/08/2021   HEMORRHAGE OF RECTUM AND ANUS 10/01/2008    Past Surgical History:  Procedure Laterality Date   MOLE REMOVAL Left     OB History   No obstetric history on file.      Home Medications    Prior to Admission medications   Medication Sig Start Date End Date Taking? Authorizing Provider  buPROPion (WELLBUTRIN XL) 150 MG 24 hr tablet Take 1 tablet every day by oral route. 07/08/22  Yes [provider]  diclofenac  (VOLTAREN ) 75 MG EC tablet Take 1 tablet (75 mg total) by mouth every 12 (twelve) hours as needed for up to 15 days (rib pain from coughing). 10/14/23 10/29/23 Yes Ival Domino, FNP  KLONOPIN 0.5 MG tablet Take 1 tablet twice a day by oral route as needed. 01/06/22  Yes [provider]  omeprazole (PRILOSEC) 20 MG capsule Take 20 mg by mouth every morning.   Yes [provider]  promethazine -dextromethorphan (PROMETHAZINE -DM) 6.25-15 MG/5ML syrup Take 5 mLs by mouth 4 (four) times daily as needed. 10/14/23  Yes  Ival Domino, FNP    Family History Family History  Problem Relation Age of Onset   Stroke Mother    Diabetes Mother    Heart attack Father     Social History Social History   Tobacco Use   Smoking status: Never   Smokeless tobacco: Never  Vaping Use   Vaping status: Never Used  Substance Use Topics   Alcohol use: Not Currently   Drug use: Never     Allergies   Penicillins   Review of Systems Review of Systems  Constitutional:  Negative for chills and fever.  HENT:  Positive for congestion. Negative for ear pain and sore throat.   Eyes:  Negative for pain and visual disturbance.  Respiratory:  Positive for cough and chest tightness. Negative for shortness of breath.   Cardiovascular:  Negative for chest pain and palpitations.  Gastrointestinal:  Negative for abdominal pain, constipation, diarrhea, nausea and vomiting.  Genitourinary:  Negative for dysuria and hematuria.  Musculoskeletal:  Negative for arthralgias and back pain.  Skin:  Negative for color change and rash.  Neurological:  Negative for seizures and syncope.  All other systems reviewed and are negative.    Physical Exam Triage Vital Signs ED Triage Vitals  Encounter Vitals Group     BP 10/14/23 1846 128/81     Girls Systolic BP Percentile --      Girls Diastolic  BP Percentile --      Boys Systolic BP Percentile --      Boys Diastolic BP Percentile --      Pulse Rate 10/14/23 1846 60     Resp 10/14/23 1846 18     Temp 10/14/23 1846 98 F (36.7 C)     Temp Source 10/14/23 1846 Oral     SpO2 10/14/23 1846 99 %     Weight --      Height --      Head Circumference --      Peak Flow --      Pain Score 10/14/23 1844 0     Pain Loc --      Pain Education --      Exclude from Growth Chart --    No data found.  Updated Vital Signs BP 128/81 (BP Location: Right Arm)   Pulse 60   Temp 98 F (36.7 C) (Oral)   Resp 18   SpO2 99%   Visual Acuity Right Eye Distance:   Left Eye Distance:    Bilateral Distance:    Right Eye Near:   Left Eye Near:    Bilateral Near:     Physical Exam Vitals and nursing note reviewed.  Constitutional:      General: She is not in acute distress.    Appearance: She is well-developed. She is not ill-appearing or toxic-appearing.  HENT:     Head: Normocephalic and atraumatic.     Right Ear: Hearing, tympanic membrane, ear canal and external ear normal.     Left Ear: Hearing, tympanic membrane, ear canal and external ear normal.     Nose: No congestion or rhinorrhea.     Right Sinus: No maxillary sinus tenderness or frontal sinus tenderness.     Left Sinus: No maxillary sinus tenderness or frontal sinus tenderness.     Mouth/Throat:     Lips: Pink.     Mouth: Mucous membranes are moist.     Pharynx: Uvula midline. No oropharyngeal exudate or posterior oropharyngeal erythema.     Tonsils: No tonsillar exudate.  Eyes:     Conjunctiva/sclera: Conjunctivae normal.     Pupils: Pupils are equal, round, and reactive to light.  Cardiovascular:     Rate and Rhythm: Normal rate and regular rhythm.     Heart sounds: S1 normal and S2 normal. No murmur heard. Pulmonary:     Effort: Pulmonary effort is normal. No respiratory distress.     Breath sounds: Normal breath sounds. No decreased breath sounds, wheezing, rhonchi or rales.  Abdominal:     General: Bowel sounds are normal.     Palpations: Abdomen is soft.     Tenderness: There is no abdominal tenderness.  Musculoskeletal:        General: No swelling.     Cervical back: Neck supple.  Lymphadenopathy:     Head:     Right side of head: No submental, submandibular, tonsillar, preauricular or posterior auricular adenopathy.     Left side of head: No submental, submandibular, tonsillar, preauricular or posterior auricular adenopathy.     Cervical: No cervical adenopathy.     Right cervical: No superficial cervical adenopathy.    Left cervical: No superficial cervical adenopathy.  Skin:     General: Skin is warm and dry.     Capillary Refill: Capillary refill takes less than 2 seconds.     Findings: No rash.  Neurological:     Mental Status: She is alert  and oriented to person, place, and time.  Psychiatric:        Mood and Affect: Mood normal.      UC Treatments / Results  Labs (all labs ordered are listed, but only abnormal results are displayed) Labs Reviewed - No data to display  EKG   Radiology DG Chest 2 View Result Date: 10/14/2023 CLINICAL DATA:  Cough EXAM: CHEST - 2 VIEW COMPARISON:  02/09/2019 FINDINGS: The heart size and mediastinal contours are within normal limits. Both lungs are clear. The visualized skeletal structures are unremarkable. IMPRESSION: No active cardiopulmonary disease. Electronically Signed   By: Oneil Devonshire M.D.   On: 10/14/2023 19:14    Procedures Procedures (including critical care time)  Medications Ordered in UC Medications - No data to display  Initial Impression / Assessment and Plan / UC Course  I have reviewed the triage vital signs and the nursing notes.  Pertinent labs & imaging results that were available during my care of the patient were reviewed by me and considered in my medical decision making (see chart for details).  Plan of Care: Acute cough and costochondritis: Promethazine  DM, 5 mL, every 6 hours if needed for cough.  Advised patient that her exam is normal her vital signs are normal and her chest x-ray is negative.  I believe that the antibiotics and the prednisone were helpful and she just needs to give them a little more time to help her symptoms.  May use diclofenac , 75 mg, twice daily if needed for chest soreness.  Follow-up if symptoms do not improve, worsen or new symptoms occur.  I reviewed the plan of care with the patient and/or the patient's guardian.  The patient and/or guardian had time to ask questions and acknowledged that the questions were answered.  I provided instruction on symptoms or reasons  to return here or to go to an ER, if symptoms/condition did not improve, worsened or if new symptoms occurred.  Final Clinical Impressions(s) / UC Diagnoses   Final diagnoses:  Acute cough  Costochondritis     Discharge Instructions      Persistent cough and some rib discomfort with cough: Chest x-ray is completely normal.  Vital signs are excellent with great oxygen saturation and exam findings are normal.  Patient has reactive airway style cough that just needs more time to resolve.  Promethazine  DM, 5 mL, every 6 hours if needed for cough.  Diclofenac  75 mg, 1 pill twice daily with food if needed for rib pain from coughing.  Get plenty of fluids and rest.  Follow-up if symptoms do not improve, worsen     ED Prescriptions     Medication Sig Dispense Auth. Provider   promethazine -dextromethorphan (PROMETHAZINE -DM) 6.25-15 MG/5ML syrup Take 5 mLs by mouth 4 (four) times daily as needed. 118 mL Ival Domino, FNP   diclofenac  (VOLTAREN ) 75 MG EC tablet Take 1 tablet (75 mg total) by mouth every 12 (twelve) hours as needed for up to 15 days (rib pain from coughing). 30 tablet Masen Luallen, FNP      PDMP not reviewed this encounter.   Ival Domino, FNP 10/14/23 [phone]

## 2024-03-15 ENCOUNTER — Other Ambulatory Visit: Payer: Self-pay | Admitting: Obstetrics and Gynecology

## 2024-03-15 DIAGNOSIS — R928 Other abnormal and inconclusive findings on diagnostic imaging of breast: Secondary | ICD-10-CM

## 2024-03-23 ENCOUNTER — Inpatient Hospital Stay: Admission: RE | Admit: 2024-03-23

## 2024-03-23 DIAGNOSIS — R928 Other abnormal and inconclusive findings on diagnostic imaging of breast: Secondary | ICD-10-CM

## 2024-03-30 ENCOUNTER — Other Ambulatory Visit

## 2024-03-30 ENCOUNTER — Encounter
